# Patient Record
Sex: Female | Born: 1945 | Race: White | Hispanic: No | Smoking: Never smoker
Health system: Southern US, Community
[De-identification: ages and names within clinical notes are randomized; demographics above are authoritative.]

## PROBLEM LIST (undated history)

## (undated) DIAGNOSIS — H9319 Tinnitus, unspecified ear: Secondary | ICD-10-CM

## (undated) HISTORY — PX: ABDOMINAL HYSTERECTOMY: SHX81

---

## 2014-12-24 ENCOUNTER — Encounter (HOSPITAL_COMMUNITY): Payer: Self-pay | Admitting: Emergency Medicine

## 2014-12-24 ENCOUNTER — Emergency Department (INDEPENDENT_AMBULATORY_CARE_PROVIDER_SITE_OTHER)
Admission: EM | Admit: 2014-12-24 | Discharge: 2014-12-24 | Disposition: A | Discharge status: Home / Self Care | Payer: Medicare Other | Source: Home / Self Care | Attending: Family Medicine | Admitting: Family Medicine

## 2014-12-24 DIAGNOSIS — H6501 Acute serous otitis media, right ear: Secondary | ICD-10-CM

## 2014-12-24 HISTORY — DX: Tinnitus, unspecified ear: H93.19

## 2014-12-24 MED ORDER — FLUTICASONE PROPIONATE 50 MCG/ACT NA SUSP: 2.0000 | Freq: Two times a day (BID) | NASAL | Status: AC

## 2014-12-24 NOTE — ED Provider Notes (Signed)
CSN: [id number]     Arrival date & time 12/24/14  1812 History   First MD Initiated Contact with Patient 12/24/14 1858     Chief Complaint  Patient presents with  . Ear Fullness   (Consider location/radiation/quality/duration/timing/severity/associated sxs/prior Treatment) HPI     69 year old female presents for evaluation of her right ear fullness and bubbling sensation. It has been doing this for 3 days. She reports a recent viral upper respiratory infection that has resolved. She denies any pain in the ear or any other systemic symptoms. Her hearing does not seem to be affected.  Past Medical History  Diagnosis Date  . Tinnitus    Past Surgical History  Procedure Laterality Date  . Abdominal hysterectomy     No family history on file. History  Substance Use Topics  . Smoking status: Never Smoker   . Smokeless tobacco: Not on file  . Alcohol Use: No   OB History    No data available     Review of Systems  HENT:       See history of present illness  All other systems reviewed and are negative.   Allergies  Sulfa antibiotics  Home Medications   Prior to Admission medications   Medication Sig Start Date End Date Taking? Authorizing Provider  fluticasone (FLONASE) 50 MCG/ACT nasal spray Place 2 sprays into both nostrils 2 (two) times daily. Decrease to 2 sprays/nostril daily after 5 days 12/24/14   Graylon GoodZachary H Cieanna Stormes, PA-C   BP 144/85 mmHg  Pulse 76  Temp(Src) 98.2 F (36.8 C) (Oral)  Resp 16  SpO2 98% Physical Exam  Constitutional: She is oriented to person, place, and time. Vital signs are normal. She appears well-developed and well-nourished. No distress.  HENT:  Head: Normocephalic and atraumatic.  Cerumen in the right ear canal, shuffling the TM. After it has been removed, there is mild injection with air-fluid levels behind the tympanic membrane  There is cerumen in the left ear canal, not obstructing the TM.  Pulmonary/Chest: Effort normal. No respiratory  distress.  Neurological: She is alert and oriented to person, place, and time. She has normal strength. Coordination normal.  Skin: Skin is warm and dry. No rash noted. She is not diaphoretic.  Psychiatric: She has a normal mood and affect. Judgment normal.  Nursing note and vitals reviewed.   ED Course  Procedures (including critical care time) Labs Review Labs Reviewed - No data to display  Imaging Review No results found.   MDM   1. Acute serous otitis media of right ear without rupture    Serous otitis media, treated with Flonase, may also take daily allergy medication. Follow-up when necessary if worsening or no improvement in a week  Meds ordered this encounter  Medications  . fluticasone (FLONASE) 50 MCG/ACT nasal spray    Sig: Place 2 sprays into both nostrils 2 (two) times daily. Decrease to 2 sprays/nostril daily after 5 days    Dispense:  16 g    Refill:  2      Graylon GoodZachary H Nyle Limb, PA-C 12/24/14 1922

## 2014-12-24 NOTE — Discharge Instructions (Signed)
Serous Otitis Media °Serous otitis media is fluid in the middle ear space. This space contains the bones for hearing and air. Air in the middle ear space helps to transmit sound.  °The air gets there through the eustachian tube. This tube goes from the back of the nose (nasopharynx) to the middle ear space. It keeps the pressure in the middle ear the same as the outside world. It also helps to drain fluid from the middle ear space. °CAUSES  °Serous otitis media occurs when the eustachian tube gets blocked. Blockage can come from: °· Ear infections. °· Colds and other upper respiratory infections. °· Allergies. °· Irritants such as cigarette smoke. °· Sudden changes in air pressure (such as descending in an airplane). °· Enlarged adenoids. °· A mass in the nasopharynx. °During colds and upper respiratory infections, the middle ear space can become temporarily filled with fluid. This can happen after an ear infection also. Once the infection clears, the fluid will generally drain out of the ear through the eustachian tube. If it does not, then serous otitis media occurs. °SIGNS AND SYMPTOMS  °· Hearing loss. °· A feeling of fullness in the ear, without pain. °· Young children may not show any symptoms but may show slight behavioral changes, such as agitation, ear pulling, or crying. °DIAGNOSIS  °Serous otitis media is diagnosed by an ear exam. Tests may be done to check on the movement of the eardrum. Hearing exams may also be done. °TREATMENT  °The fluid most often goes away without treatment. If allergy is the cause, allergy treatment may be helpful. Fluid that persists for several months may require minor surgery. A small tube is placed in the eardrum to: °· Drain the fluid. °· Restore the air in the middle ear space. °In certain situations, antibiotic medicines are used to avoid surgery. Surgery may be done to remove enlarged adenoids (if this is the cause). °HOME CARE INSTRUCTIONS  °· Keep children away from  tobacco smoke. °· Keep all follow-up visits as directed by your health care provider. °SEEK MEDICAL CARE IF:  °· Your hearing is not better in 3 months. °· Your hearing is worse. °· You have ear pain. °· You have drainage from the ear. °· You have dizziness. °· You have serous otitis media only in one ear or have any bleeding from your nose (epistaxis). °· You notice a lump on your neck. °MAKE SURE YOU: °· Understand these instructions.   °· Will watch your condition.   °· Will get help right away if you are not doing well or get worse.   °Document Released: 12/28/2003 Document Revised: 02/21/2014 Document Reviewed: 05/04/2013 °[employer]® Patient Information ©2015 [employer]. This information is not intended to replace advice given to you by your health care provider. Make sure you discuss any questions you have with your health care provider. ° °

## 2014-12-24 NOTE — ED Notes (Signed)
Patient c/o ear fullness with "gurgling" x 3 days. denies pain or use of drops recently. Patient is in NAD.

## 2015-04-03 ENCOUNTER — Encounter (HOSPITAL_COMMUNITY): Payer: Self-pay | Admitting: Emergency Medicine

## 2015-04-03 ENCOUNTER — Emergency Department (HOSPITAL_COMMUNITY)
Admission: EM | Admit: 2015-04-03 | Discharge: 2015-04-03 | Disposition: A | Discharge status: Home / Self Care | Payer: Medicare Other | Attending: Emergency Medicine | Admitting: Emergency Medicine

## 2015-04-03 ENCOUNTER — Emergency Department (HOSPITAL_COMMUNITY): Payer: Medicare Other

## 2015-04-03 ENCOUNTER — Emergency Department (HOSPITAL_COMMUNITY): Admission: EM | Admit: 2015-04-03 | Discharge: 2015-04-03 | Discharge status: Absent without leave | Payer: Self-pay

## 2015-04-03 DIAGNOSIS — Z7951 Long term (current) use of inhaled steroids: Secondary | ICD-10-CM | POA: Diagnosis not present

## 2015-04-03 DIAGNOSIS — W5503XA Scratched by cat, initial encounter: Secondary | ICD-10-CM | POA: Insufficient documentation

## 2015-04-03 DIAGNOSIS — Z8669 Personal history of other diseases of the nervous system and sense organs: Secondary | ICD-10-CM | POA: Insufficient documentation

## 2015-04-03 DIAGNOSIS — L03115 Cellulitis of right lower limb: Secondary | ICD-10-CM | POA: Diagnosis not present

## 2015-04-03 DIAGNOSIS — S80811A Abrasion, right lower leg, initial encounter: Secondary | ICD-10-CM | POA: Insufficient documentation

## 2015-04-03 DIAGNOSIS — Y9289 Other specified places as the place of occurrence of the external cause: Secondary | ICD-10-CM | POA: Insufficient documentation

## 2015-04-03 DIAGNOSIS — Y998 Other external cause status: Secondary | ICD-10-CM | POA: Insufficient documentation

## 2015-04-03 DIAGNOSIS — Z23 Encounter for immunization: Secondary | ICD-10-CM | POA: Insufficient documentation

## 2015-04-03 DIAGNOSIS — S81831A Puncture wound without foreign body, right lower leg, initial encounter: Secondary | ICD-10-CM | POA: Diagnosis not present

## 2015-04-03 DIAGNOSIS — Y9389 Activity, other specified: Secondary | ICD-10-CM | POA: Diagnosis not present

## 2015-04-03 MED ORDER — TETANUS-DIPHTH-ACELL PERTUSSIS 5-2.5-18.5 LF-MCG/0.5 IM SUSP
0.5000 mL | Freq: Once | INTRAMUSCULAR | Status: AC
  Administered 2015-04-03: 0.5 mL via INTRAMUSCULAR
  Filled 2015-04-03: qty 0.5

## 2015-04-03 MED ORDER — AMOXICILLIN-POT CLAVULANATE 875-125 MG PO TABS: 1.0000 | ORAL_TABLET | Freq: Two times a day (BID) | ORAL | Status: AC

## 2015-04-03 NOTE — ED Notes (Signed)
Pt returned from X Ray.

## 2015-04-03 NOTE — ED Notes (Signed)
Pt verbalizes understanding of d/c instructions and denies any further needs at this time. 

## 2015-04-03 NOTE — ED Notes (Signed)
Patient coming from home with cat bite to the right lower leg on Saturday.  Area around the bite is reddened, no drainage at present.

## 2015-04-03 NOTE — Discharge Instructions (Signed)
Cat Scratch Disease Cats often injure people by scratching or biting. This site of injury can become infected with a particular germ or bacteria present in the mouth of or on the cat. This germ is called Bartonella henselae. This infection is identified by the common name cat scratch disease (CSD).  SYMPTOMS  A red and sore pimple or bump, with or without pus, on the skin where the cat scratched or bit. The pimple or sore may be present for as long as three weeks after the scratch or bite occurred.  One or more enlarged lymph glands located toward the center of the body from where the injury occurred.  Less common symptoms include low-grade fever, tiredness, fatigue, headache and/or sore throat. DIAGNOSIS  The diagnosis is typically made by your caregiver who notes the history of a scratch or bite from a cat, and finds the skin sore and swollen lymph glands in the described area.  Culture of any drainage or pus from the injury site, or a needle aspiration or piece of tissue (biopsy) from a swollen lymph gland may also be done to confirm the diagnosis and assure that a different infection or disease is not causing your illness. Rare but serious complications may occur, they include:  Parinaud's syndrome - fever, swollen lymph glands and inflammation of the eye (conjunctivitis).  Infection of the brain (encephalitis).  Infection of the nerve of the eye (neuroretinitis).  Infection of the bone (osteomyelitis). TREATMENT  Usually treatment is not necessary or helpful, especially if you have a normal immune system. When infection is very severe, it may be treated with a medicine that kills the bacteria (antibiotic).  People with immune system problems (such as having AIDS or an organ transplant, or being on steroids or other immune modifying drugs) should be treated with antibiotics. HOME CARE INSTRUCTIONS   Avoid injury while playing with cats.  Wash well after playing with cats.  Do  not let your cat lick sores on your body.  Do not let your cat roam around outside of your house.  Keep the area of the cat scratch clean. Wash it with soap and water or apply an antiseptic solution such as povidone iodine.  You should get a tetanus shot if you have not had one in the past 5 or 10 years. If you receive one, your arm may get swollen and red and warm to the touch at the shot site. This is a common response to the medication in the shot. If you did not receive a tetanus shot here because you did not recall when your last one was given, make sure to check with your caregiver's office and determine if one is needed. Generally, for a "dirty" wound, you should receive a tetanus booster if you have not had one in the last five years. If you have a "clean" wound, you should receive a tetanus booster if you have not had one in the last ten years. SEEK IMMEDIATE MEDICAL CARE IF:   You have worsening signs of infection, such as more redness, increased pain, red streaking or pus coming from the wound, or warmth or swelling around the area of the scratch.  You develop worsening swollen lymph glands.  You develop abdominal pain, have problems with your vision or develop a skin rash.  You have a fever.  You become more tired or dizzy, or have a worsening headache.  You develop inflammation of your eye or have increasing vision problems.  You have pain in   one of your bones.  You develop a stiff neck.  You pass out. MAKE SURE YOU:   Understand these instructions.  Will watch your condition.  Will get help right away if you are not doing well or get worse. Document Released: 10/04/2000 Document Revised: 12/30/2011 Document Reviewed: 11/16/2008 Exit[employer] Patient Information 2015 [employer]. This information is not intended to replace advice given to you by your health care provider. Make sure you discuss any questions you have with your health care provider.  

## 2015-04-03 NOTE — ED Provider Notes (Signed)
CSN: [id number]     Arrival date & time 04/03/15  1351 History  This chart was scribed for non-physician practitioner, Teressa Lower, NP, working with Jerelyn Gowdy, MD, by Ronney Lion, ED Scribe. This patient was seen in room TR09C/TR09C and the patient's care was started at 2:07 PM.    No chief complaint on file.  The history is provided by the patient. No language interpreter was used.     HPI Comments: Nicole Conway is a 69 y.o. female who presents to the Emergency Department complaining of a puncture wound that occurred 2 days ago when she was petting what she assumed to be a neighborhood cat and got scratched. She states she is unsure where the cat came from as she was just moving into the neighborhood, but she assumed it belonged to someone because it appeared clean and was wearing a pretty collar. She adds that her daughter was also bitten by the same cat 2 days ago, but her daughter has been asymptomatic since, and her bite wound healed cleanly. Patient however has had some redness and pain to the area. She states she cannot remember the date of her last tetanus vaccine.    Past Medical History  Diagnosis Date  . Tinnitus    Past Surgical History  Procedure Laterality Date  . Abdominal hysterectomy     No family history on file. History  Substance Use Topics  . Smoking status: Never Smoker   . Smokeless tobacco: Not on file  . Alcohol Use: No   OB History    No data available     Review of Systems  Skin: Positive for color change (redness) and wound (cat claw puncture wound to right lower extremity).  All other systems reviewed and are negative.   Allergies  Sulfa antibiotics  Home Medications   Prior to Admission medications   Medication Sig Start Date End Date Taking? Authorizing Provider  fluticasone (FLONASE) 50 MCG/ACT nasal spray Place 2 sprays into both nostrils 2 (two) times daily. Decrease to 2 sprays/nostril daily after 5 days 12/24/14   Graylon Good,  PA-C   There were no vitals taken for this visit. Physical Exam  Constitutional: She is oriented to person, place, and time. She appears well-developed and well-nourished. No distress.  HENT:  Head: Normocephalic and atraumatic.  Eyes: Conjunctivae and EOM are normal.  Neck: Neck supple. No tracheal deviation present.  Cardiovascular: Normal rate.   Pulmonary/Chest: Effort normal and breath sounds normal. No respiratory distress.  Musculoskeletal: Normal range of motion.  Neurological: She is alert and oriented to person, place, and time.  Skin:  Three puncture wounds to the right lower leg with redness and warmth to the shin. No drainage  Psychiatric: She has a normal mood and affect. Her behavior is normal.  Nursing note and vitals reviewed.   ED Course  Procedures (including critical care time)  COORDINATION OF CARE: 2:09 PM - Suspect cellulitis. Discussed treatment plan with pt at bedside which includes Rx antibiotics and XR, and pt agreed to plan. Offered pt the option of a rabies vaccine, but pt declined.  3:03 PM - Negative XR results. Pt made aware.  Dg Tibia/fibula Right  04/03/2015   CLINICAL DATA:  Skin  oozing after cat scratching  EXAM: RIGHT TIBIA AND FIBULA - 2 VIEW  COMPARISON:  None.  FINDINGS: Two views of the right tibia-fibula submitted. No acute fracture or subluxation. Mild soft tissue swelling anterior tibial region.  IMPRESSION: No acute fracture  or subluxation. Mild soft tissue swelling anterior tibial region.   Electronically Signed   By: Natasha Mead M.D.   On: 04/03/2015 14:54    MDM   Final diagnoses:  Cat scratch of lower leg, right, initial encounter  Cellulitis of right lower extremity   No definite abscess or cellulitis. Pt doesn't want rabies vaccine. Started on augmentin. Pt given return precautions.tetanus updated  I personally performed the services described in this documentation, which was scribed in my presence. The recorded information has  been reviewed and is accurate.     Teressa Lower, NP 04/03/15 1523  Jerelyn Daino, MD 04/03/15 [phone]

## 2016-12-22 IMAGING — DX DG TIBIA/FIBULA 2V*R*
2 series · 2 of 2 positions shown · non-contrast
Comparison: None.

CLINICAL DATA: Skin  oozing after cat scratching

EXAM:
RIGHT TIBIA AND FIBULA - 2 VIEW

[tibia ap]
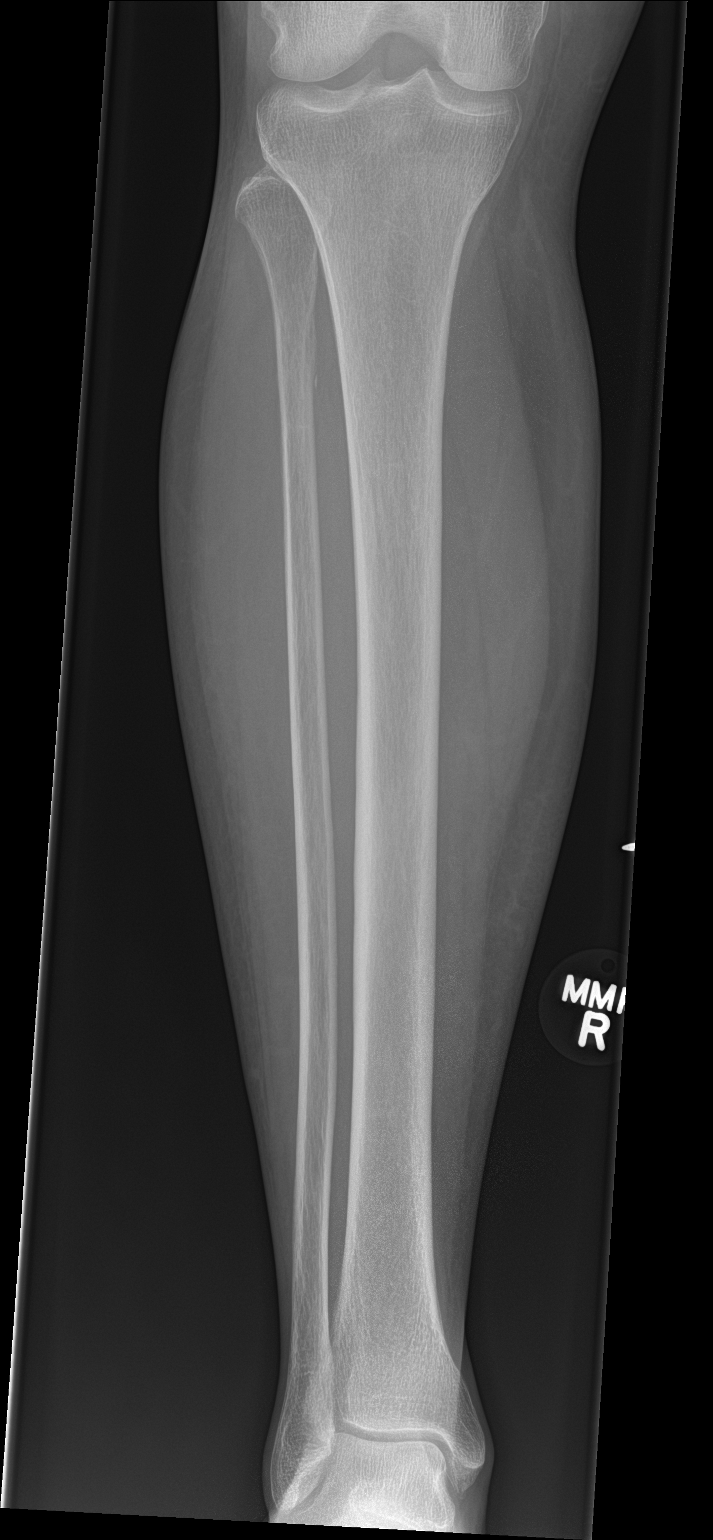

[tibia lat]
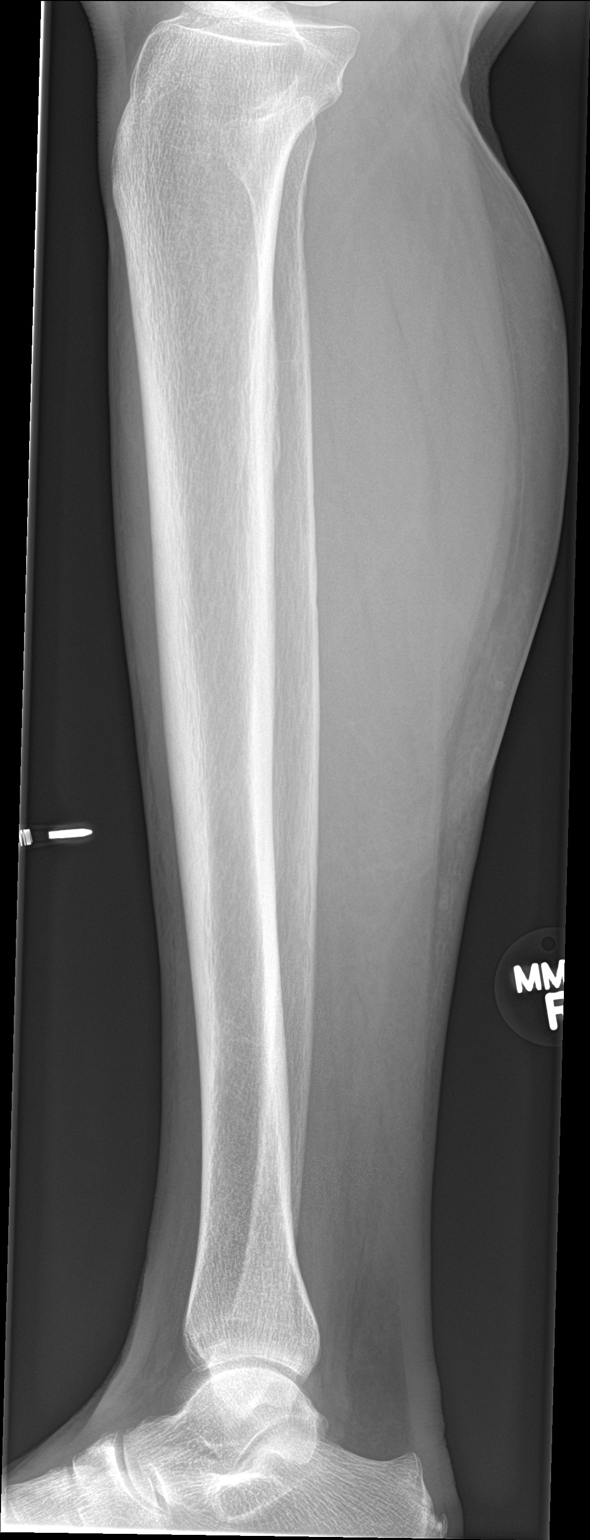

[2 of 2 positions shown; findings below may reference images not displayed]

FINDINGS: Two views of the right tibia-fibula submitted. No acute fracture or
subluxation. Mild soft tissue swelling anterior tibial region.
IMPRESSION: No acute fracture or subluxation. Mild soft tissue swelling anterior
tibial region.
